# Patient Record
Sex: Female | Born: 1975 | Race: Black or African American | Hispanic: No | Marital: Married | Smoking: Never smoker
Health system: Southern US, Community
[De-identification: ages and names within clinical notes are randomized; demographics above are authoritative.]

## PROBLEM LIST (undated history)

## (undated) HISTORY — PX: EYE SURGERY: SHX253

---

## 1998-04-03 ENCOUNTER — Emergency Department (HOSPITAL_COMMUNITY): Admission: EM | Admit: 1998-04-03 | Discharge: 1998-04-03 | Payer: Self-pay | Admitting: Emergency Medicine

## 1998-04-03 ENCOUNTER — Encounter: Payer: Self-pay | Admitting: Emergency Medicine

## 1998-12-13 ENCOUNTER — Other Ambulatory Visit: Admission: RE | Admit: 1998-12-13 | Discharge: 1998-12-13 | Payer: Self-pay | Admitting: *Deleted

## 1999-12-18 ENCOUNTER — Other Ambulatory Visit: Admission: RE | Admit: 1999-12-18 | Discharge: 1999-12-18 | Payer: Self-pay | Admitting: *Deleted

## 2001-01-28 ENCOUNTER — Other Ambulatory Visit: Admission: RE | Admit: 2001-01-28 | Discharge: 2001-01-28 | Payer: Self-pay | Admitting: Obstetrics and Gynecology

## 2002-02-24 ENCOUNTER — Other Ambulatory Visit: Admission: RE | Admit: 2002-02-24 | Discharge: 2002-02-24 | Payer: Self-pay | Admitting: Obstetrics and Gynecology

## 2003-04-14 ENCOUNTER — Other Ambulatory Visit: Admission: RE | Admit: 2003-04-14 | Discharge: 2003-04-14 | Payer: Self-pay | Admitting: Obstetrics and Gynecology

## 2004-11-01 ENCOUNTER — Inpatient Hospital Stay (HOSPITAL_COMMUNITY): Admission: AD | Admit: 2004-11-01 | Discharge: 2004-11-01 | Payer: Self-pay | Admitting: Obstetrics and Gynecology

## 2004-11-02 ENCOUNTER — Inpatient Hospital Stay (HOSPITAL_COMMUNITY): Admission: AD | Admit: 2004-11-02 | Discharge: 2004-11-02 | Payer: Self-pay | Admitting: Obstetrics & Gynecology

## 2004-11-06 ENCOUNTER — Inpatient Hospital Stay (HOSPITAL_COMMUNITY): Admission: AD | Admit: 2004-11-06 | Discharge: 2004-11-06 | Payer: Self-pay | Admitting: Obstetrics and Gynecology

## 2004-11-19 ENCOUNTER — Inpatient Hospital Stay (HOSPITAL_COMMUNITY): Admission: AD | Admit: 2004-11-19 | Discharge: 2004-11-22 | Payer: Self-pay | Admitting: Obstetrics and Gynecology

## 2006-12-26 ENCOUNTER — Inpatient Hospital Stay (HOSPITAL_COMMUNITY): Admission: AD | Admit: 2006-12-26 | Discharge: 2006-12-26 | Payer: Self-pay | Admitting: Obstetrics and Gynecology

## 2007-01-01 ENCOUNTER — Inpatient Hospital Stay (HOSPITAL_COMMUNITY): Admission: AD | Admit: 2007-01-01 | Discharge: 2007-01-03 | Payer: Self-pay | Admitting: Obstetrics

## 2008-05-20 ENCOUNTER — Ambulatory Visit (HOSPITAL_COMMUNITY): Admission: RE | Admit: 2008-05-20 | Discharge: 2008-05-20 | Payer: Self-pay | Admitting: Obstetrics and Gynecology

## 2008-07-16 ENCOUNTER — Ambulatory Visit (HOSPITAL_COMMUNITY): Admission: RE | Admit: 2008-07-16 | Discharge: 2008-07-16 | Payer: Self-pay | Admitting: Obstetrics and Gynecology

## 2008-08-13 ENCOUNTER — Ambulatory Visit (HOSPITAL_COMMUNITY): Admission: RE | Admit: 2008-08-13 | Discharge: 2008-08-13 | Payer: Self-pay | Admitting: Obstetrics and Gynecology

## 2008-10-20 ENCOUNTER — Inpatient Hospital Stay (HOSPITAL_COMMUNITY): Admission: AD | Admit: 2008-10-20 | Discharge: 2008-10-23 | Payer: Self-pay | Admitting: Obstetrics and Gynecology

## 2010-03-20 ENCOUNTER — Encounter: Payer: Self-pay | Admitting: Obstetrics and Gynecology

## 2010-06-03 LAB — CBC
HCT: 33.2 % — ABNORMAL LOW (ref 36.0–46.0)
HCT: 33.3 % — ABNORMAL LOW (ref 36.0–46.0)
Hemoglobin: 11 g/dL — ABNORMAL LOW (ref 12.0–15.0)
MCHC: 33 g/dL (ref 30.0–36.0)
MCHC: 33.3 g/dL (ref 30.0–36.0)
MCV: 84.4 fL (ref 78.0–100.0)
Platelets: 160 10*3/uL (ref 150–400)
RBC: 3.95 MIL/uL (ref 3.87–5.11)
RDW: 15.9 % — ABNORMAL HIGH (ref 11.5–15.5)
WBC: 6.7 10*3/uL (ref 4.0–10.5)

## 2010-06-03 LAB — RPR: RPR Ser Ql: NONREACTIVE

## 2010-07-14 NOTE — Consult Note (Signed)
NAMEARYANI, Kathryn Hernandez             ACCOUNT NO.:  [id number]   MEDICAL RECORD NO.:  [id number]          PATIENT TYPE:  MAT   LOCATION:  [facility]                          FACILITY:  [facility]   PHYSICIAN:  Lenoard Aden, M.D.DATE OF BIRTH:  May 31, 1975   DATE OF CONSULTATION:  11/06/2004  DATE OF DISCHARGE:                                   CONSULTATION   CHIEF COMPLAINT:  Rule out labor.   HISTORY OF PRESENT ILLNESS:  The patient is a 35 year old African-American  female, G2, P0, EDD November 20, 2004, at [redacted] weeks gestation, who presents  with increased frequency of contractions and questionable cervical change  today.   MEDICATIONS:  Prenatal vitamins.   ALLERGIES:  No known drug allergies.   PAST MEDICAL HISTORY:  1.  She has a history of a complete spontaneous pregnancy loss in 1999.  2.  History of eye surgery at age 84.   SOCIAL HISTORY:  She is a nonsmoker, nondrinker.  Denies domestic or  physical violence.   FAMILY HISTORY:  Seizures, leukemia, and hypertension.   PRENATAL LABORATORY DATA:  A blood type of O positive, Rh antibody negative,  rubella immune, hepatitis negative, HIV declined.  GBS is positive.   PHYSICAL EXAMINATION:  GENERAL:  She is a well-developed, well-nourished  African-American female in no acute distress.  HEENT:  Normal.  LUNGS:  Clear.  HEART:  Regular rhythm.  ABDOMEN:  Soft, gravid, nontender.  PELVIC:  Cervical exam is 2 cm dilated, 50%, vertex posterior, -2.  EXTREMITIES:  No cords.  NEUROLOGIC:  Nonfocal.   NST is reactive.  Contractions are ranging from every 2 to 10 minutes and  irregular.   IMPRESSION:  Probable prodromal labor pattern without evidence of cervical  change.   PLAN:  Will ambulate and recheck cervix.  Probable discharge home.      Lenoard Aden, M.D.  Electronically Signed     RJT/MEDQ  D:  11/06/2004  T:  11/06/2004  Job:  [id number]   cc:   Ma Hillock

## 2010-12-05 LAB — CBC
HCT: 26.3 — ABNORMAL LOW
HCT: 33.5 — ABNORMAL LOW
Hemoglobin: 11.3 — ABNORMAL LOW
Hemoglobin: 8.8 — ABNORMAL LOW
MCHC: 33.4
MCHC: 33.6
MCV: 80
MCV: 82.1
Platelets: 186
RBC: 4.19
RDW: 14.9 — ABNORMAL HIGH
WBC: 7.8

## 2010-12-05 LAB — RPR: RPR Ser Ql: NONREACTIVE

## 2011-02-25 IMAGING — US US OB DETAIL+14 WK
1 series · 18 of 28 positions shown · non-contrast
Comparison: none

OBSTETRICAL ULTRASOUND:
 This ultrasound was performed in The [HOSPITAL], and the [facility] report will be stored to [REDACTED] PACS.

[Series 1: us ob detail+14 wk · 18 of 102 slices shown]
[im 1/102]
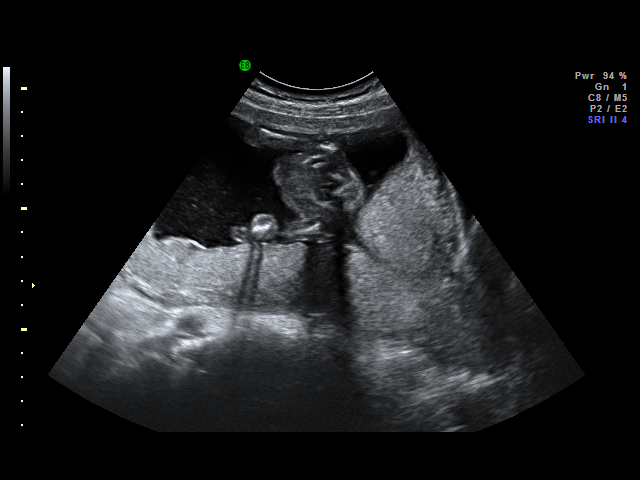
[im 8/102]
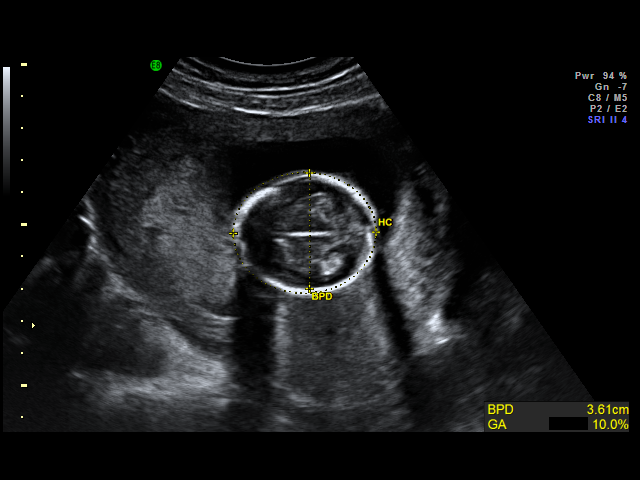
[im 12/102]
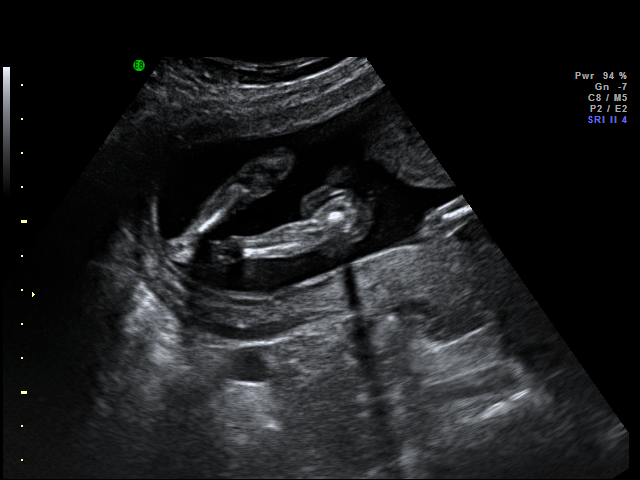
[im 19/102]
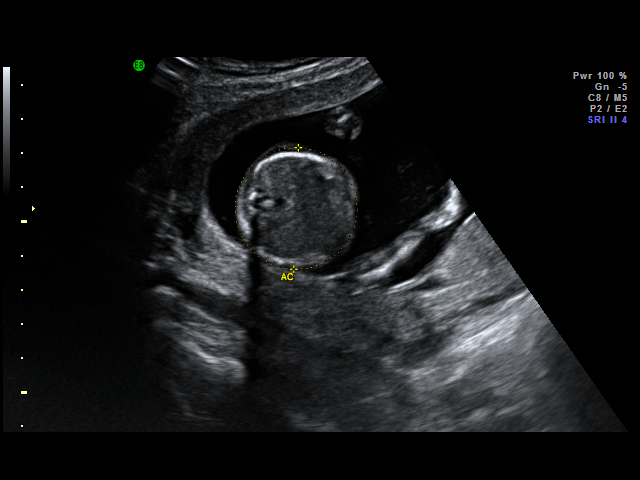
[im 27/102]
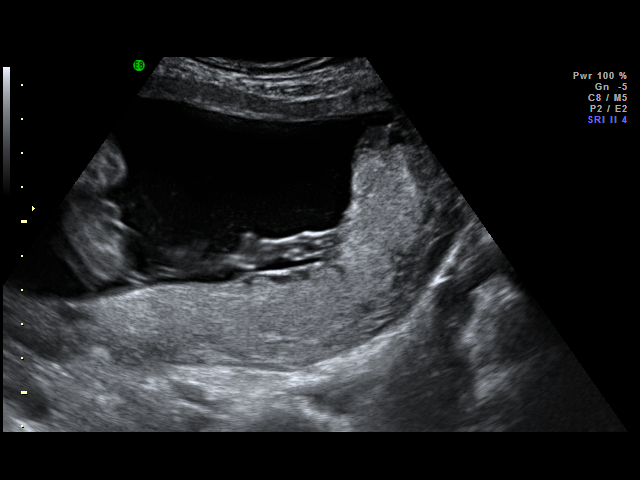
[im 30/102]
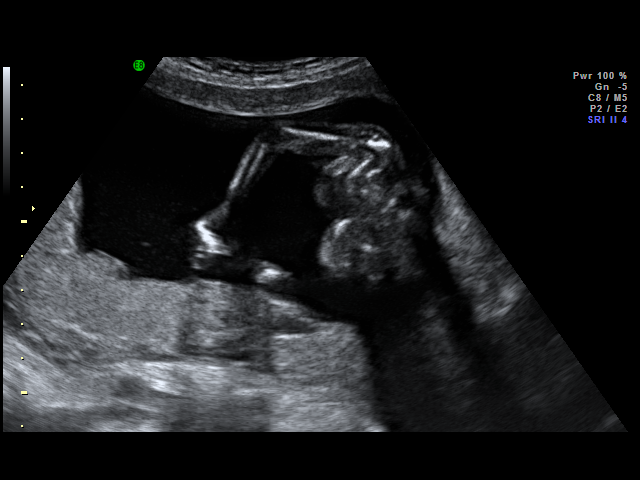
[im 38/102]
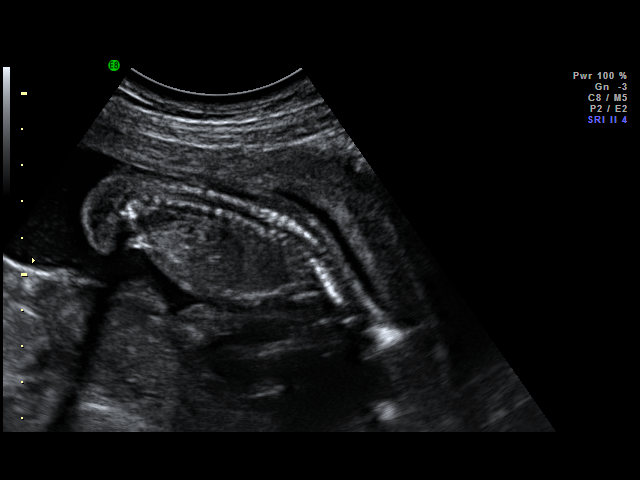
[im 42/102]
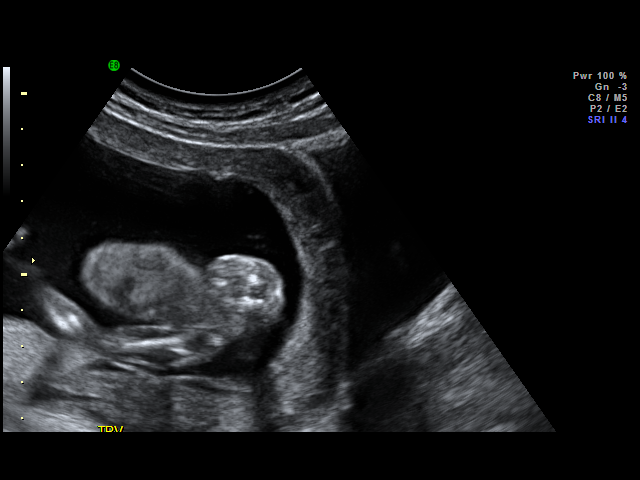
[im 49/102]
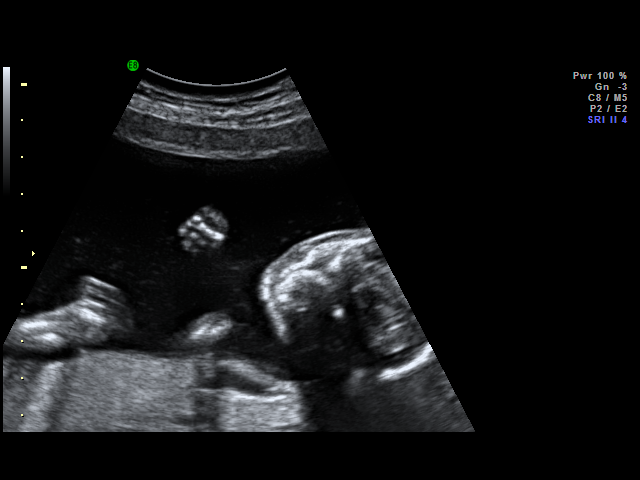
[im 53/102]
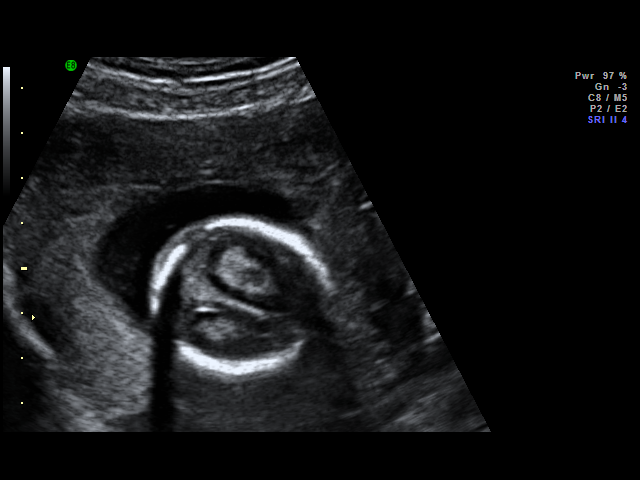
[im 60/102]
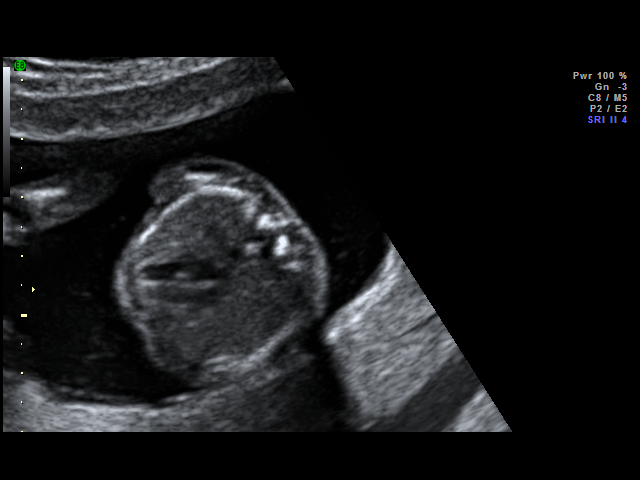
[im 64/102]
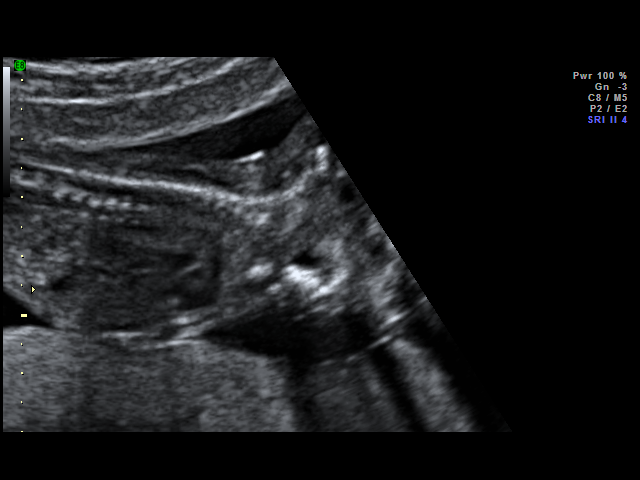
[im 72/102]
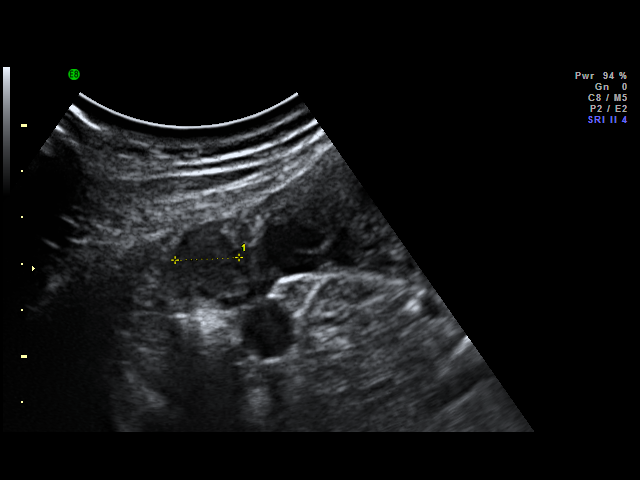
[im 79/102]
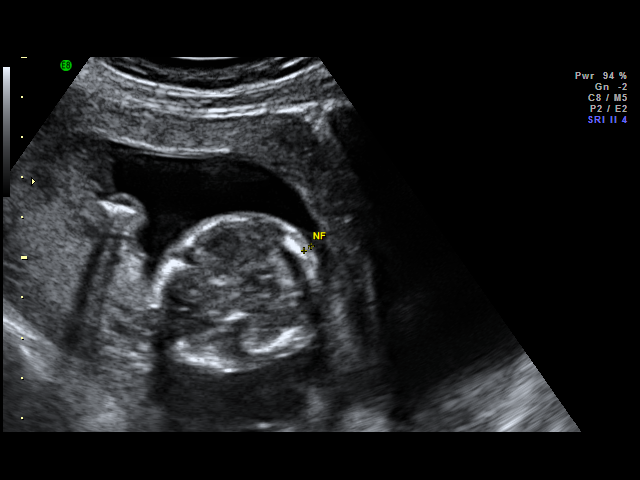
[im 83/102]
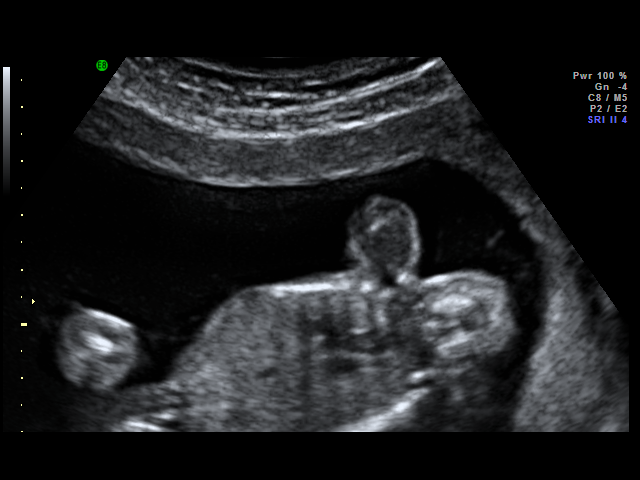
[im 90/102]
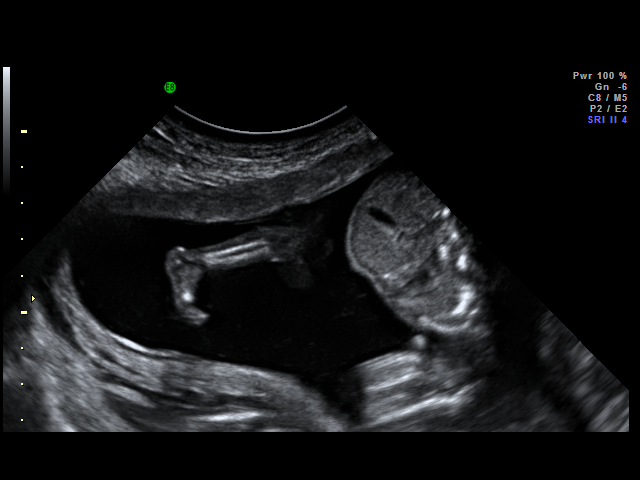
[im 94/102]
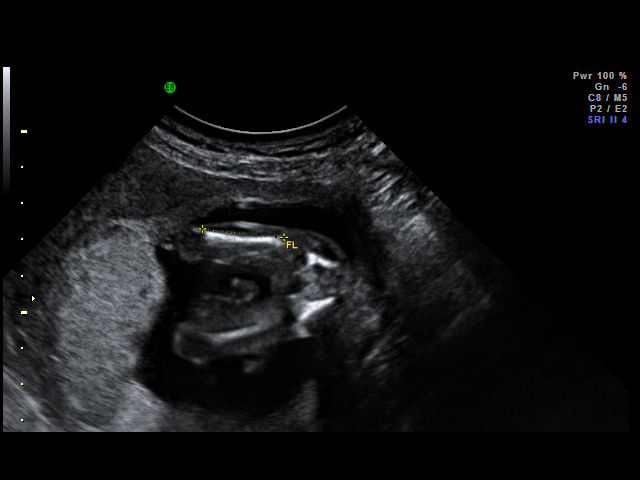
[im 102/102]
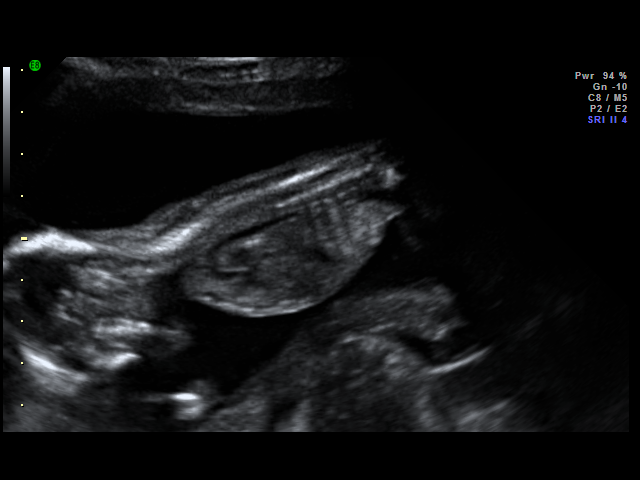

[18 of 28 positions shown; findings below may reference images not displayed]

IMPRESSION: [facility] has also been faxed to the ordering physician.

## 2015-11-12 ENCOUNTER — Ambulatory Visit (INDEPENDENT_AMBULATORY_CARE_PROVIDER_SITE_OTHER): Payer: BC Managed Care – PPO | Admitting: Urgent Care

## 2015-11-12 VITALS — BP 110/68 | HR 83 | Temp 98.0°F | Resp 16 | Ht 65.0 in | Wt 129.0 lb

## 2015-11-12 DIAGNOSIS — Z0000 Encounter for general adult medical examination without abnormal findings: Secondary | ICD-10-CM

## 2015-11-12 DIAGNOSIS — H6123 Impacted cerumen, bilateral: Secondary | ICD-10-CM | POA: Diagnosis not present

## 2015-11-12 DIAGNOSIS — Z111 Encounter for screening for respiratory tuberculosis: Secondary | ICD-10-CM

## 2015-11-12 NOTE — Progress Notes (Signed)
MRN: [id number]  Subjective:   Kathryn Hernandez is a 40 y.o. female presenting for annual physical exam.  Medical care team includes: PCP: Dr. Cherly Hensenousins OB/GYN: Pap smear completed with Dr. Cherly Hensenousins in 03/2015. Specialists: None.  Patient had her bloodwork completed with Dr. Cherly Hensenousins in 03/2015. 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 She has no risk factors per the tb risk questionnaire but insists on having blood work done any way.   Health Maintenance: Declines HIV testing.    Kathryn Hernandez does not have any active problems on his problem list.   Kathryn Hernandez has a current medication list which includes the following prescription(s): norethindrone-ethinyl estradiol-iron. She has no allergies on file.  Kathryn Hernandez  has no past medical history on file. Also  has a past surgical history that includes Eye surgery.  family history is not on file.  Immunizations: Up to date per patient, declines influenza vaccine.  Review of Systems  Constitutional: Negative for chills, diaphoresis, fever, malaise/fatigue and weight loss.  HENT: Negative for congestion, ear discharge, ear pain, hearing loss, nosebleeds, sore throat and tinnitus.   Eyes: Negative for blurred vision, double vision, photophobia, pain, discharge and redness.  Respiratory: Negative for cough, shortness of breath and wheezing.   Cardiovascular: Negative for chest pain, palpitations and leg swelling.  Gastrointestinal: Negative for abdominal pain, blood in stool, constipation, diarrhea, nausea and vomiting.  Genitourinary: Negative for dysuria, flank pain, frequency, hematuria and urgency.  Musculoskeletal: Negative for back pain, joint pain and myalgias.  Skin: Negative for itching and rash.  Neurological: Negative for dizziness, tingling, seizures, loss of consciousness, weakness and headaches.  Endo/Heme/Allergies: Negative for polydipsia.  Psychiatric/Behavioral: Negative for depression,  hallucinations, memory loss, substance abuse and suicidal ideas. The patient is not nervous/anxious and does not have insomnia.     Objective:   Vitals: BP 110/68 (BP Location: Right Arm, Patient Position: Sitting, Cuff Size: Small)   Pulse 83   Temp 98 F (36.7 C) (Oral)   Resp 16   Ht 5\' 5"  (1.651 m)   Wt 129 lb (58.5 kg)   LMP 11/07/2015   SpO2 99%   BMI 21.47 kg/m   Physical Exam  Constitutional: She is oriented to person, place, and time. She appears well-developed and well-nourished.  HENT:  TM's occluded by cerumen bilaterally, no effusions or erythema. Nasal turbinates pink and moist, nasal passages patent. No sinus tenderness. Oropharynx clear, mucous membranes moist, dentition in good repair.  Eyes: Conjunctivae and EOM are normal. Pupils are equal, round, and reactive to light. Right eye exhibits no discharge. Left eye exhibits no discharge. No scleral icterus.  Neck: Normal range of motion. Neck supple. No thyromegaly present.  Cardiovascular: Normal rate, regular rhythm and intact distal pulses.  Exam reveals no gallop and no friction rub.   No murmur heard. Pulmonary/Chest: No respiratory distress. She has no wheezes. She has no rales.  Abdominal: Soft. Bowel sounds are normal. She exhibits no distension and no mass. There is no tenderness.  Musculoskeletal: Normal range of motion. She exhibits no edema or tenderness.  Lymphadenopathy:    She has no cervical adenopathy.  Neurological: She is alert and oriented to person, place, and time. She has normal reflexes.  Skin: Skin is warm and dry. No rash noted. No erythema. No pallor.  Psychiatric: She has a normal mood and affect.   Assessment and Plan :   1. Annual physical exam - Form for [employer] completed. Discussed healthy lifestyle, diet, exercise, preventative care, vaccinations, and addressed patient's concerns.   2. Tuberculosis screening - Quantiferon tb gold assay (blood) pending.  3. Cerumen  impaction, bilateral - Ear wax removal performed.   Wallis Bamberg, PA-C [facility] [phone] 11/12/2015  9:50 AM

## 2015-11-12 NOTE — Patient Instructions (Addendum)
Keeping You Healthy  Get These Tests 1. Blood Pressure- Have your blood pressure checked once a year by your health care provider.  Normal blood pressure is 120/80. 2. Weight- Have your body mass index (BMI) calculated to screen for obesity.  BMI is measure of body fat based on height and weight.  You can also calculate your own BMI at www.nhlbisupport.com/bmi/. 3. Cholesterol- Have your cholesterol checked every 5 years starting at age 39 then yearly starting at age 45. 4. Chlamydia, HIV, and other sexually transmitted diseases- Get screened every year until age 25, then within three months of each new sexual provider. 5. Pap Test - Every 1-5 years; discuss with your health care provider. 6. Mammogram- Every 1-2 years starting at age 40--50  Take these medicines  Calcium with Vitamin D-Your body needs 1200 mg of Calcium each day and 800-1000 IU of Vitamin D daily.  Your body can only absorb 500 mg of Calcium at a time so Calcium must be taken in 2 or 3 divided doses throughout the day.  Multivitamin with folic acid- Once daily if it is possible for you to become pregnant.  Get these Immunizations  Gardasil-Series of three doses; prevents HPV related illness such as genital warts and cervical cancer.  Menactra-Single dose; prevents meningitis.  Tetanus shot- Every 10 years.  Flu shot-Every year.  Take these steps 1. Do not smoke-Your healthcare provider can help you quit.  For tips on how to quit go to www.smokefree.gov or call 1-800 QUITNOW. 2. Be physically active- Exercise 5 days a week for at least 30 minutes.  If you are not already physically active, start slow and gradually work up to 30 minutes of moderate physical activity.  Examples of moderate activity include walking briskly, dancing, swimming, bicycling, etc. 3. Breast Cancer- A self breast exam every month is important for early detection of breast cancer.  For more information and instruction on self breast exams, ask your  healthcare provider or www.womenshealth.gov/faq/breast-self-exam.cfm. 4. Eat a healthy diet- Eat a variety of healthy foods such as fruits, vegetables, whole grains, low fat milk, low fat cheeses, yogurt, lean meats, poultry and fish, beans, nuts, tofu, etc.  For more information go to www. Thenutritionsource.org 5. Drink alcohol in moderation- Limit alcohol intake to one drink or less per day. Never drink and drive. 6. Depression- Your emotional health is as important as your physical health.  If you're feeling down or losing interest in things you normally enjoy please talk to your healthcare provider about being screened for depression. 7. Dental visit- Brush and floss your teeth twice daily; visit your dentist twice a year. 8. Eye doctor- Get an eye exam at least every 2 years. 9. Helmet use- Always wear a helmet when riding a bicycle, motorcycle, rollerblading or skateboarding. 10. Safe sex- If you may be exposed to sexually transmitted infections, use a condom. 11. Seat belts- Seat belts can save your live; always wear one. 12. Smoke/Carbon Monoxide detectors- These detectors need to be installed on the appropriate level of your home. Replace batteries at least once a year. 13. Skin cancer- When out in the sun please cover up and use sunscreen 15 SPF or higher. 14. Violence- If anyone is threatening or hurting you, please tell your healthcare provider.           IF you received an x-ray today, you will receive an invoice from [facility]. Please contact [facility] at [phone] with questions or concerns regarding your invoice.     IF you received labwork today, you will receive an invoice from [facility]. Please contact [facility] at [phone] with questions or concerns regarding your invoice.   Our billing staff will not be able to assist you with questions regarding bills from these companies.  You will be contacted with the lab  results as soon as they are available. The fastest way to get your results is to activate your My Chart account. Instructions are located on the last page of this paperwork. If you have not heard from us regarding the results in 2 weeks, please contact this office.     

## 2015-11-14 ENCOUNTER — Encounter: Payer: Self-pay | Admitting: Urgent Care

## 2015-11-14 LAB — QUANTIFERON TB GOLD ASSAY (BLOOD)
Interferon Gamma Release Assay: NEGATIVE
Mitogen-Nil: >10 IU/mL
QUANTIFERON TB AG MINUS NIL: 0 [IU]/mL
Quantiferon Nil Value: 0.03 IU/mL

## 2015-11-16 ENCOUNTER — Telehealth: Payer: Self-pay

## 2015-11-16 NOTE — Telephone Encounter (Signed)
Pt left form for Urban GibsonMani to fill out at her visit on Saturday.  The only thing that needed to be filled out where her TB results.  Those are back, but she needs form asap.  Call her at [phone]

## 2015-11-18 NOTE — Telephone Encounter (Signed)
I searched through the box, and I do not see the form in Mani's area.  He will return on Wednesday.  I and Deliah BostonMichael Clark will watch the box until then.  Did she pick this up already?

## 2015-11-21 NOTE — Telephone Encounter (Signed)
Message left fopt to call back.

## 2019-09-29 ENCOUNTER — Ambulatory Visit: Payer: Self-pay | Attending: Internal Medicine

## 2019-09-29 DIAGNOSIS — Z23 Encounter for immunization: Secondary | ICD-10-CM

## 2019-09-29 NOTE — Progress Notes (Signed)
   Covid-19 Vaccination Clinic  Name:  CHAYLA SHANDS    MRN: [id number] DOB: 1975/09/07  09/29/2019  Ms. Westberg was observed post Covid-19 immunization for 15 minutes without incident. She was provided with Vaccine Information Sheet and instruction to access the V-Safe system.   Ms. Marlar was instructed to call 911 with any severe reactions post vaccine: Marland Kitchen Difficulty breathing  . Swelling of face and throat  . A fast heartbeat  . A bad rash all over body  . Dizziness and weakness   Immunizations Administered    Name Date Dose VIS Date Route   Pfizer COVID-19 Vaccine 09/29/2019  1:03 PM 0.3 mL 04/22/2018 Intramuscular   Manufacturer: [employer], [employer]   Lot: [id number]   NDC: [address]-8
